# Patient Record
Sex: Male | Born: 1985 | Race: White | Hispanic: No | Marital: Single | Smoking: Current every day smoker
Health system: Southern US, Community
[De-identification: ages and names within clinical notes are randomized; demographics above are authoritative.]

---

## 2017-12-29 ENCOUNTER — Emergency Department (HOSPITAL_COMMUNITY): Payer: No Typology Code available for payment source

## 2017-12-29 ENCOUNTER — Other Ambulatory Visit: Payer: Self-pay

## 2017-12-29 ENCOUNTER — Emergency Department (HOSPITAL_COMMUNITY)
Admission: EM | Admit: 2017-12-29 | Discharge: 2017-12-29 | Disposition: A | Discharge status: Home / Self Care | Payer: No Typology Code available for payment source | Attending: Emergency Medicine | Admitting: Emergency Medicine

## 2017-12-29 ENCOUNTER — Encounter (HOSPITAL_COMMUNITY): Payer: Self-pay | Admitting: Emergency Medicine

## 2017-12-29 DIAGNOSIS — F17210 Nicotine dependence, cigarettes, uncomplicated: Secondary | ICD-10-CM | POA: Diagnosis not present

## 2017-12-29 DIAGNOSIS — Y998 Other external cause status: Secondary | ICD-10-CM | POA: Insufficient documentation

## 2017-12-29 DIAGNOSIS — Y92410 Unspecified street and highway as the place of occurrence of the external cause: Secondary | ICD-10-CM | POA: Insufficient documentation

## 2017-12-29 DIAGNOSIS — S93121A Dislocation of metatarsophalangeal joint of right great toe, initial encounter: Secondary | ICD-10-CM | POA: Diagnosis not present

## 2017-12-29 DIAGNOSIS — S99921A Unspecified injury of right foot, initial encounter: Secondary | ICD-10-CM | POA: Diagnosis present

## 2017-12-29 DIAGNOSIS — Y939 Activity, unspecified: Secondary | ICD-10-CM | POA: Insufficient documentation

## 2017-12-29 DIAGNOSIS — S93104A Unspecified dislocation of right toe(s), initial encounter: Secondary | ICD-10-CM

## 2017-12-29 MED ORDER — HYDROMORPHONE HCL 1 MG/ML IJ SOLN
1.0000 mg | Freq: Once | INTRAMUSCULAR | Status: AC
  Administered 2017-12-29: 1 mg via INTRAMUSCULAR
  Filled 2017-12-29: qty 1

## 2017-12-29 MED ORDER — LIDOCAINE HCL (PF) 1 % IJ SOLN
INTRAMUSCULAR | Status: AC
  Administered 2017-12-29: 6 mL
  Filled 2017-12-29: qty 6

## 2017-12-29 MED ORDER — LIDOCAINE HCL (PF) 1 % IJ SOLN
30.0000 mL | Freq: Once | INTRAMUSCULAR | Status: DC
  Filled 2017-12-29: qty 30

## 2017-12-29 MED ORDER — IBUPROFEN 600 MG PO TABS: 600.0000 mg | ORAL_TABLET | Freq: Three times a day (TID) | ORAL | 0 refills | Status: AC | PRN

## 2017-12-29 MED ORDER — POVIDONE-IODINE 10 % EX SOLN
CUTANEOUS | Status: AC
  Filled 2017-12-29: qty 15
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
